# Patient Record
Sex: Female | Born: 1995 | Race: White | Hispanic: No | Marital: Single | Smoking: Never smoker
Health system: Southern US, Community
[De-identification: ages and names within clinical notes are randomized; demographics above are authoritative.]

## PROBLEM LIST (undated history)

## (undated) DIAGNOSIS — J45909 Unspecified asthma, uncomplicated: Secondary | ICD-10-CM

## (undated) DIAGNOSIS — J302 Other seasonal allergic rhinitis: Secondary | ICD-10-CM

## (undated) DIAGNOSIS — F319 Bipolar disorder, unspecified: Secondary | ICD-10-CM

## (undated) DIAGNOSIS — F329 Major depressive disorder, single episode, unspecified: Secondary | ICD-10-CM

## (undated) DIAGNOSIS — F429 Obsessive-compulsive disorder, unspecified: Secondary | ICD-10-CM

## (undated) DIAGNOSIS — F419 Anxiety disorder, unspecified: Secondary | ICD-10-CM

## (undated) DIAGNOSIS — F4310 Post-traumatic stress disorder, unspecified: Secondary | ICD-10-CM

---

## 2018-03-10 ENCOUNTER — Other Ambulatory Visit: Payer: Self-pay

## 2018-03-10 ENCOUNTER — Encounter (HOSPITAL_COMMUNITY): Payer: Self-pay | Admitting: Emergency Medicine

## 2018-03-10 ENCOUNTER — Emergency Department (HOSPITAL_COMMUNITY): Payer: Commercial Managed Care - PPO

## 2018-03-10 ENCOUNTER — Emergency Department (HOSPITAL_COMMUNITY)
Admission: EM | Admit: 2018-03-10 | Discharge: 2018-03-10 | Disposition: A | Discharge status: Home / Self Care | Payer: Commercial Managed Care - PPO | Attending: Emergency Medicine | Admitting: Emergency Medicine

## 2018-03-10 DIAGNOSIS — R110 Nausea: Secondary | ICD-10-CM | POA: Diagnosis not present

## 2018-03-10 DIAGNOSIS — R197 Diarrhea, unspecified: Secondary | ICD-10-CM | POA: Insufficient documentation

## 2018-03-10 DIAGNOSIS — R1031 Right lower quadrant pain: Secondary | ICD-10-CM | POA: Diagnosis not present

## 2018-03-10 DIAGNOSIS — J45909 Unspecified asthma, uncomplicated: Secondary | ICD-10-CM | POA: Diagnosis not present

## 2018-03-10 HISTORY — DX: Unspecified asthma, uncomplicated: J45.909

## 2018-03-10 HISTORY — DX: Other seasonal allergic rhinitis: J30.2

## 2018-03-10 LAB — URINALYSIS, ROUTINE W REFLEX MICROSCOPIC
Bilirubin Urine: NEGATIVE
Glucose, UA: NEGATIVE mg/dL
Hgb urine dipstick: NEGATIVE
Ketones, ur: NEGATIVE mg/dL
Nitrite: NEGATIVE
PH: 7 (ref 5.0–8.0)
Protein, ur: NEGATIVE mg/dL
Specific Gravity, Urine: 1.016 (ref 1.005–1.030)

## 2018-03-10 LAB — CBC WITH DIFFERENTIAL/PLATELET
Abs Immature Granulocytes: 0.03 10*3/uL (ref 0.00–0.07)
BASOS PCT: 0 %
Basophils Absolute: 0 10*3/uL (ref 0.0–0.1)
Eosinophils Absolute: 0.1 10*3/uL (ref 0.0–0.5)
Eosinophils Relative: 1 %
HCT: 39.9 % (ref 36.0–46.0)
Hemoglobin: 13.6 g/dL (ref 12.0–15.0)
Immature Granulocytes: 0 %
Lymphocytes Relative: 28 %
Lymphs Abs: 2.7 10*3/uL (ref 0.7–4.0)
MCH: 29.9 pg (ref 26.0–34.0)
MCHC: 34.1 g/dL (ref 30.0–36.0)
MCV: 87.7 fL (ref 80.0–100.0)
Monocytes Absolute: 0.9 10*3/uL (ref 0.1–1.0)
Monocytes Relative: 10 %
NEUTROS ABS: 5.7 10*3/uL (ref 1.7–7.7)
Neutrophils Relative %: 61 %
PLATELETS: 261 10*3/uL (ref 150–400)
RBC: 4.55 MIL/uL (ref 3.87–5.11)
RDW: 12.1 % (ref 11.5–15.5)
WBC: 9.5 10*3/uL (ref 4.0–10.5)
nRBC: 0 % (ref 0.0–0.2)

## 2018-03-10 LAB — COMPREHENSIVE METABOLIC PANEL
ALT: 33 U/L (ref 0–44)
AST: 32 U/L (ref 15–41)
Albumin: 3.8 g/dL (ref 3.5–5.0)
Alkaline Phosphatase: 51 U/L (ref 38–126)
Anion gap: 8 (ref 5–15)
BUN: 9 mg/dL (ref 6–20)
CO2: 20 mmol/L — ABNORMAL LOW (ref 22–32)
Calcium: 9.3 mg/dL (ref 8.9–10.3)
Chloride: 107 mmol/L (ref 98–111)
Creatinine, Ser: 0.79 mg/dL (ref 0.44–1.00)
GFR calc Af Amer: >60 mL/min (ref >60)
GFR calc non Af Amer: >60 mL/min (ref >60)
Glucose, Bld: 84 mg/dL (ref 70–99)
POTASSIUM: 4 mmol/L (ref 3.5–5.1)
Sodium: 135 mmol/L (ref 135–145)
TOTAL PROTEIN: 6.9 g/dL (ref 6.5–8.1)
Total Bilirubin: 0.8 mg/dL (ref 0.3–1.2)

## 2018-03-10 LAB — I-STAT BETA HCG BLOOD, ED (MC, WL, AP ONLY): I-stat hCG, quantitative: <5 m[IU]/mL (ref <5)

## 2018-03-10 MED ORDER — SODIUM CHLORIDE 0.9 % IV BOLUS
1000.0000 mL | Freq: Once | INTRAVENOUS | Status: AC
  Administered 2018-03-10: 1000 mL via INTRAVENOUS

## 2018-03-10 MED ORDER — ONDANSETRON HCL 4 MG/2ML IJ SOLN
4.0000 mg | Freq: Once | INTRAMUSCULAR | Status: AC
  Administered 2018-03-10: 4 mg via INTRAVENOUS
  Filled 2018-03-10: qty 2

## 2018-03-10 MED ORDER — IOHEXOL 300 MG/ML  SOLN
100.0000 mL | Freq: Once | INTRAMUSCULAR | Status: AC | PRN
  Administered 2018-03-10: 100 mL via INTRAVENOUS

## 2018-03-10 NOTE — ED Notes (Signed)
Pt transported to CT ?

## 2018-03-10 NOTE — ED Provider Notes (Signed)
[facility] EMERGENCY DEPARTMENT Provider Note   CSN: [id number] Arrival date & time: 03/10/18  1311     History   Chief Complaint Chief Complaint  Patient presents with  . Abdominal Pain    HPI Marvelle Cervantes is a 23 y.o. female with a past medical history of IBS, who presents to ED for evaluation of right lower quadrant abdominal pain that began 3 hours prior to arrival.  She reports sharp pain, worse with palpation.  She had several episodes of diarrhea since waking up this morning.  She reports nausea but no emesis.  She is concerned that she may have appendicitis.  Denies any urinary symptoms, vaginal complaints, possibility of pregnancy, concern for STDs, prior abdominal surgeries.  She has not tried taking anything at home to help with her symptoms.  She is currently sexually active with one female partner. No recent abx use.  HPI  Past Medical History:  Diagnosis Date  . Asthma   . Seasonal allergies     There are no active problems to display for this patient.   History reviewed. No pertinent surgical history.   OB History   No obstetric history on file.      Home Medications    Prior to Admission medications   Not on File    Family History No family history on file.  Social History Social History   Tobacco Use  . Smoking status: Never Smoker  . Smokeless tobacco: Never Used  Substance Use Topics  . Alcohol use: Yes  . Drug use: Never     Allergies   Augmentin [amoxicillin-pot clavulanate]   Review of Systems Review of Systems  Constitutional: Negative for appetite change, chills and fever.  HENT: Negative for ear pain, rhinorrhea, sneezing and sore throat.   Eyes: Negative for photophobia and visual disturbance.  Respiratory: Negative for cough, chest tightness, shortness of breath and wheezing.   Cardiovascular: Negative for chest pain and palpitations.  Gastrointestinal: Positive for abdominal pain, diarrhea and nausea.  Negative for blood in stool, constipation and vomiting.  Genitourinary: Negative for dysuria, hematuria and urgency.  Musculoskeletal: Negative for myalgias.  Skin: Negative for rash.  Neurological: Negative for dizziness, weakness and light-headedness.     Physical Exam Updated Vital Signs BP 136/90   Pulse 80   Temp 98.8 F (37.1 C)   Resp 18   Ht 5\' 11"  (1.803 m)   Wt 81.6 kg   LMP 02/27/2018   SpO2 100%   BMI 25.10 kg/m   Physical Exam Vitals signs and nursing note reviewed.  Constitutional:      General: She is not in acute distress.    Appearance: She is well-developed.  HENT:     Head: Normocephalic and atraumatic.     Nose: Nose normal.  Eyes:     General: No scleral icterus.       Left eye: No discharge.     Conjunctiva/sclera: Conjunctivae normal.  Neck:     Musculoskeletal: Normal range of motion and neck supple.  Cardiovascular:     Rate and Rhythm: Normal rate and regular rhythm.     Heart sounds: Normal heart sounds. No murmur. No friction rub. No gallop.   Pulmonary:     Effort: Pulmonary effort is normal. No respiratory distress.     Breath sounds: Normal breath sounds.  Abdominal:     General: Bowel sounds are normal. There is no distension.     Palpations: Abdomen is soft.  Tenderness: There is abdominal tenderness. There is no guarding or rebound.    Musculoskeletal: Normal range of motion.  Skin:    General: Skin is warm and dry.     Findings: No rash.  Neurological:     Mental Status: She is alert.     Motor: No abnormal muscle tone.     Coordination: Coordination normal.      ED Treatments / Results  Labs (all labs ordered are listed, but only abnormal results are displayed) Labs Reviewed  COMPREHENSIVE METABOLIC PANEL - Abnormal; Notable for the following components:      Result Value   CO2 20 (*)    All other components within normal limits  URINALYSIS, ROUTINE W REFLEX MICROSCOPIC - Abnormal; Notable for the following  components:   APPearance HAZY (*)    Leukocytes, UA TRACE (*)    Bacteria, UA RARE (*)    All other components within normal limits  CBC WITH DIFFERENTIAL/PLATELET  I-STAT BETA HCG BLOOD, ED ([facility], [facility], [facility] ONLY)    EKG None  Radiology No results found.  Procedures Procedures (including critical care time)  Medications Ordered in ED Medications  sodium chloride 0.9 % bolus 1,000 mL (has no administration in time range)  ondansetron (ZOFRAN) injection 4 mg (has no administration in time range)  iohexol (OMNIPAQUE) 300 MG/ML solution 100 mL (100 mLs Intravenous Contrast Given 03/10/18 1448)     Initial Impression / Assessment and Plan / ED Course  I have reviewed the triage vital signs and the nursing notes.  Pertinent labs & imaging results that were available during my care of the patient were reviewed by me and considered in my medical decision making (see chart for details).     23yo F with a past medical history of IBS presents to ED for evaluation of RLQ abdominal pain, nausea and diarrhea since this morning.  States that she has had several episodes of diarrhea and then began developing the right lower quadrant abdominal pain for the past 3 hours.  Denies any urinary symptoms, vaginal complaints, possibility of pregnancy.  States she is sexually active with one female partner, is not concerned for STDs and denies vaginal discharge.  On my exam there is tenderness palpation of the right middle/right lower quadrant without rebound or guarding.  Vital signs are within normal limits.  Plan is to obtain lab work, CT of the abdomen pelvis to evaluate for appendicitis or other acute pathology.  Patient will be given fluids and Zofran. Care handed off to oncoming provider.  Final Clinical Impressions(s) / ED Diagnoses   Final diagnoses:  None    ED Discharge Orders    None       Dietrich PatesKhatri, Shaquile Lutze, PA-C 03/10/18 1459    Sabas SousBero, Michael M, MD 03/11/18 1044

## 2018-03-10 NOTE — Discharge Instructions (Signed)
Please return if worsening (vomiting, severe diarrhea, severe, lasting abdominal pain)

## 2018-03-10 NOTE — ED Provider Notes (Signed)
Pt signed out to me by [facility] PA-C. She had severe R sided abdominal pain this morning which has since resolved. She has had nausea and a couple episodes of diarrhea. CT abdomen/pelvis is pending at shift change to r/o appendicitis. Labs and UA are normal.  CT is negative. Rechecked pt. Abdomen is soft and non-tender. Could be transient GI illness with nausea and diarrhea. She was offered pelvic and declined. She was given reassurance and return precautions.   Bethel Born, PA-C 03/10/18 1637    Sabas Sous, MD 03/11/18 [phone]

## 2018-03-10 NOTE — ED Triage Notes (Signed)
Pt report r lower abd pain with diarrhea x1 day

## 2019-11-01 IMAGING — CT CT ABD-PELV W/ CM
2 of 4 series · 16 of 46 positions shown, 18 images · IV contrast (APPLIED)
Comparison: None.

CLINICAL DATA: Right lower quadrant abdominal pain and diarrhea for
the past day.

EXAM:
CT ABDOMEN AND PELVIS WITH CONTRAST
TECHNIQUE: Multidetector CT imaging of the abdomen and pelvis was performed
using the standard protocol following bolus administration of
intravenous contrast.
CONTRAST:  100mL OMNIPAQUE IOHEXOL 300 MG/ML  SOLN

[Series 3: abdomen 5.0 · axial · 0.82mm/px · z∈[+745,+1195]mm · 13 of 101 slices shown, 15 images]
[im 6/101  soft-tissue]
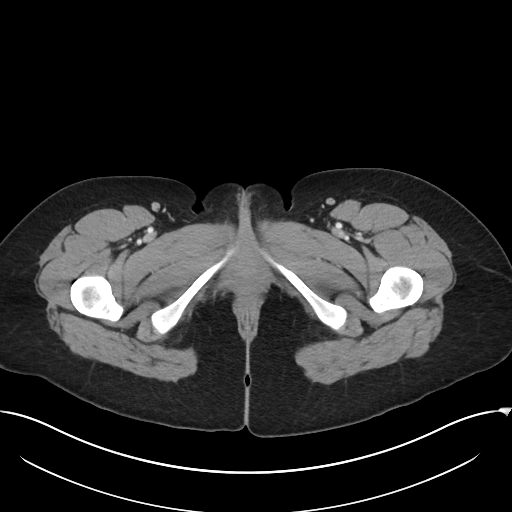
[im 6/101  bone]
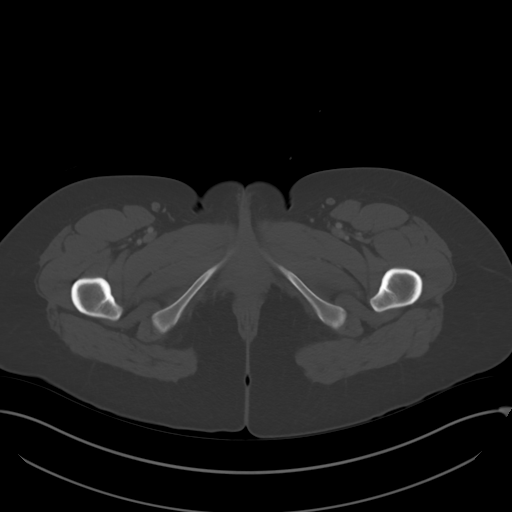
[im 16/101  soft-tissue]
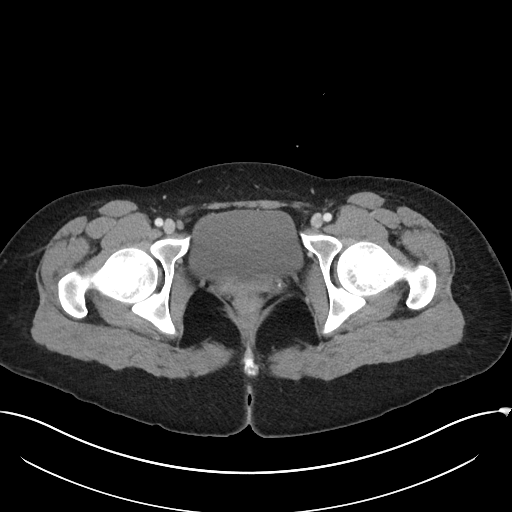
[im 21/101  soft-tissue]
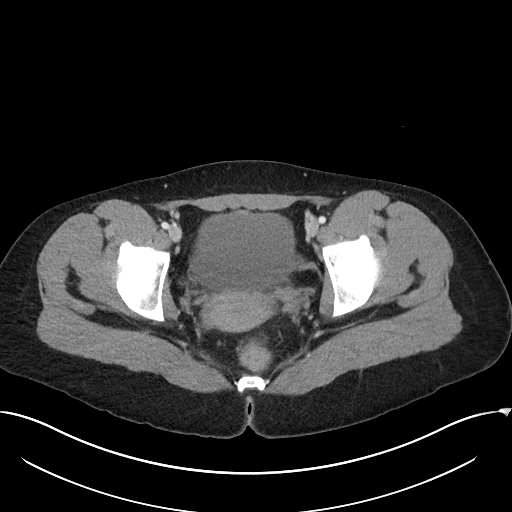
[im 31/101  soft-tissue]
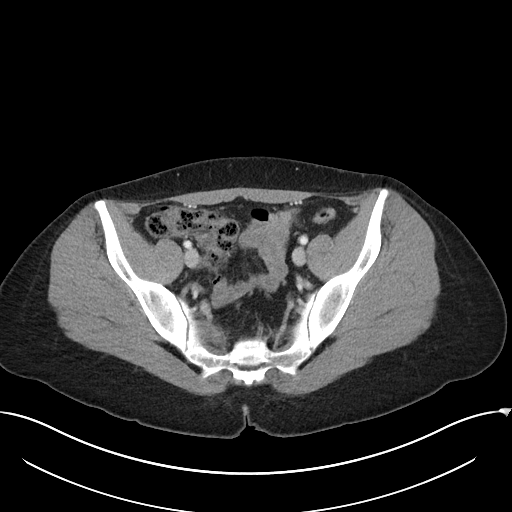
[im 36/101  soft-tissue]
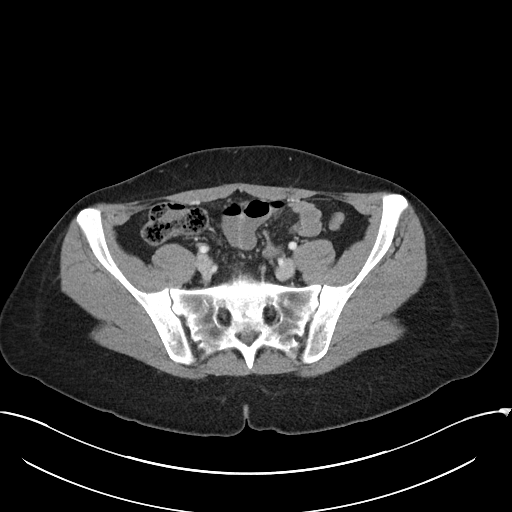
[im 46/101  soft-tissue]
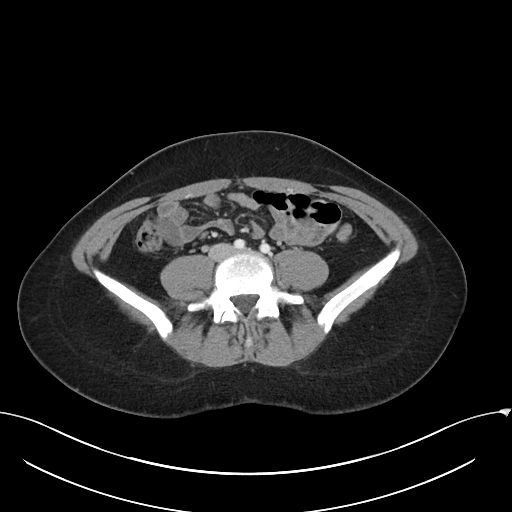
[im 51/101  soft-tissue]
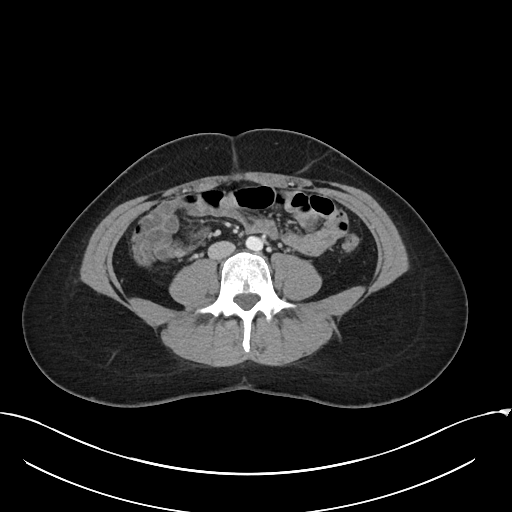
[im 56/101  soft-tissue]
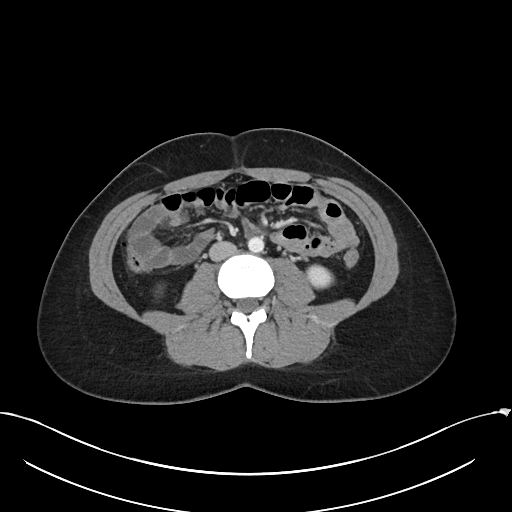
[im 66/101  soft-tissue]
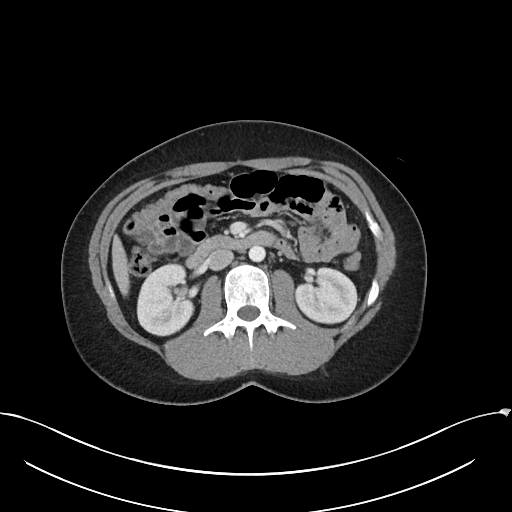
[im 66/101  bone]
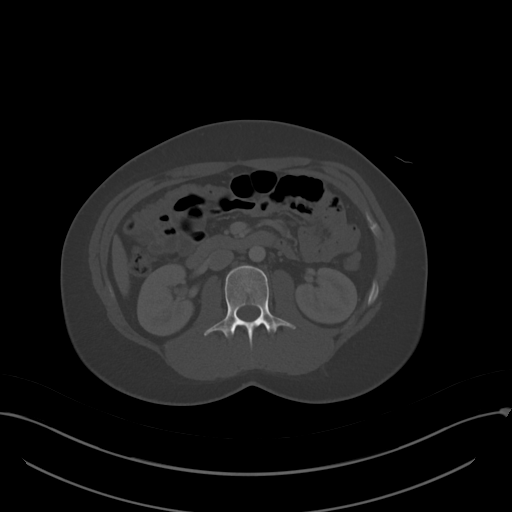
[im 71/101  soft-tissue]
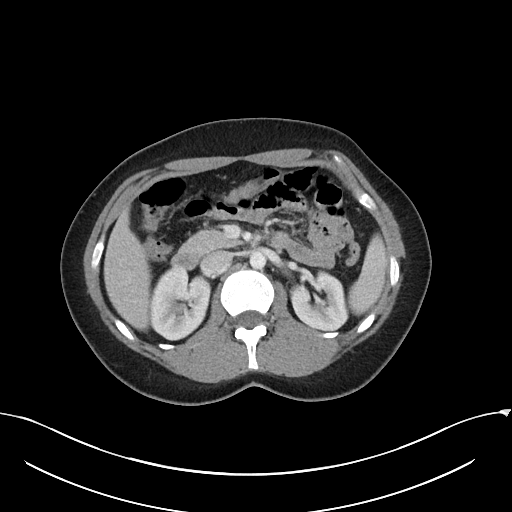
[im 81/101  soft-tissue]
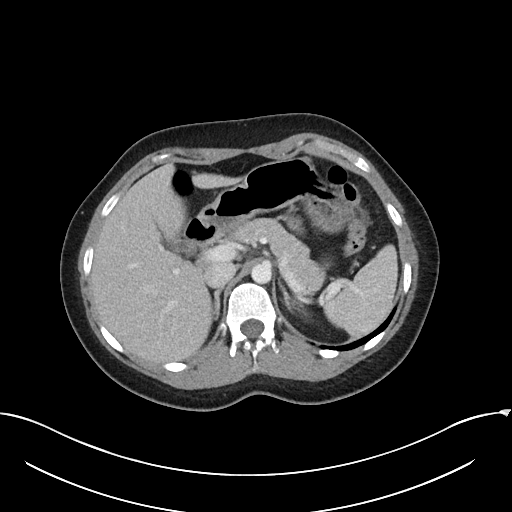
[im 86/101  soft-tissue]
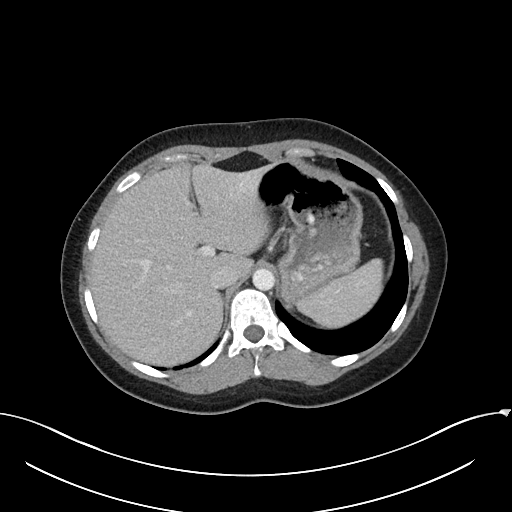
[im 96/101  soft-tissue]
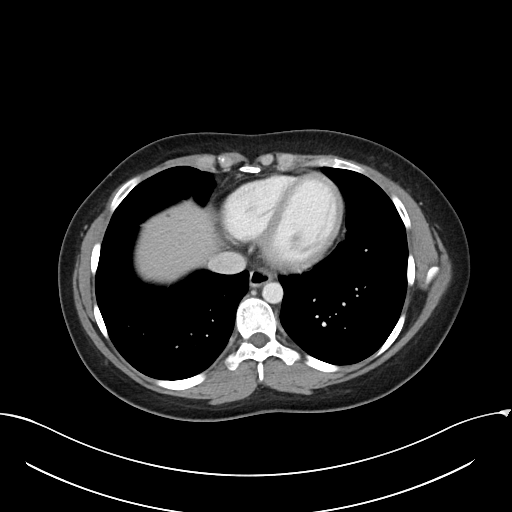

[Series 6: abdomen 3.0 mpr cor · coronal · 0.85mm/px · 3 of 101 slices shown]
[im 34/101  soft-tissue]
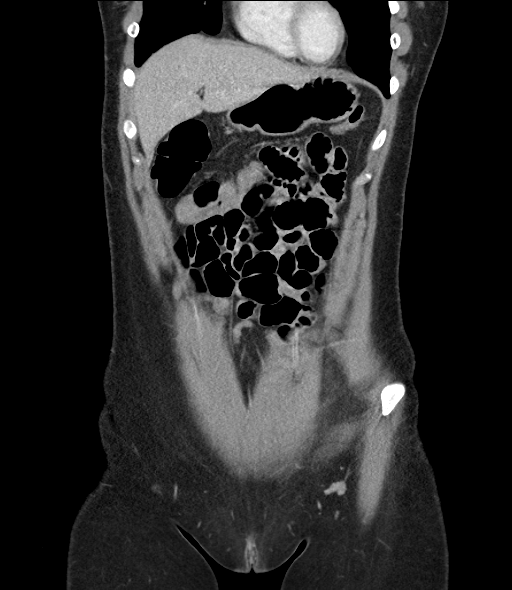
[im 45/101  soft-tissue]
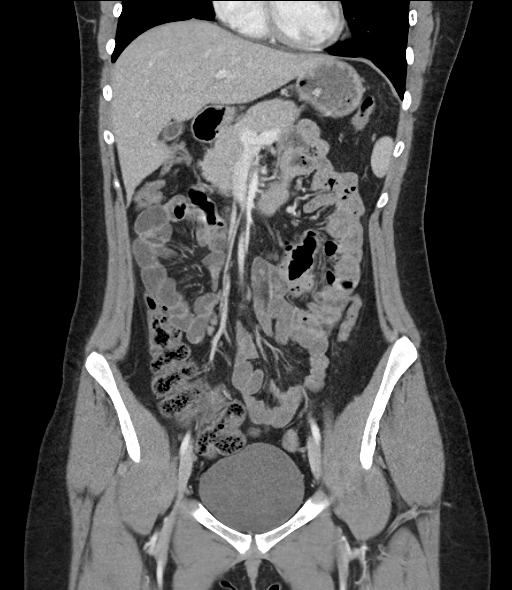
[im 56/101  soft-tissue]
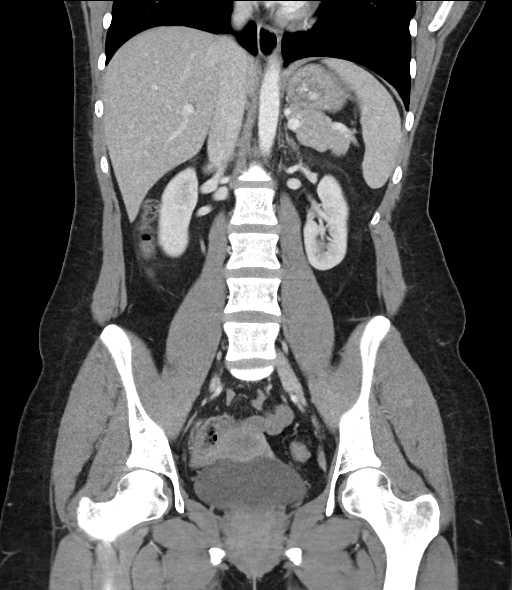

[16 of 46 positions shown; findings below may reference images not displayed]

FINDINGS: Lower chest: No acute abnormality.

Hepatobiliary: No focal liver abnormality is seen. No gallstones,
gallbladder wall thickening, or biliary dilatation.

Pancreas: Unremarkable. No pancreatic ductal dilatation or
surrounding inflammatory changes.

Spleen: Normal in size without focal abnormality.

Adrenals/Urinary Tract: Adrenal glands are unremarkable. Kidneys are
normal, without renal calculi, focal lesion, or hydronephrosis.
Bladder is unremarkable.

Stomach/Bowel: Stomach is within normal limits. Appendix appears
normal. No evidence of bowel wall thickening, distention, or
inflammatory changes.

Vascular/Lymphatic: No significant vascular findings are present. No
enlarged abdominal or pelvic lymph nodes.

Reproductive: Uterus and bilateral adnexa are unremarkable.

Other: Tiny fat containing umbilical hernia. No free fluid or
pneumoperitoneum.

Musculoskeletal: No acute or significant osseous findings.
IMPRESSION: 1.  No acute intra-abdominal process.  Normal appendix.

## 2020-11-05 ENCOUNTER — Encounter: Payer: Self-pay | Admitting: Emergency Medicine

## 2020-11-05 ENCOUNTER — Ambulatory Visit
Admission: EM | Admit: 2020-11-05 | Discharge: 2020-11-05 | Disposition: A | Discharge status: Home / Self Care | Payer: No Typology Code available for payment source

## 2020-11-05 DIAGNOSIS — R21 Rash and other nonspecific skin eruption: Secondary | ICD-10-CM | POA: Diagnosis not present

## 2020-11-05 DIAGNOSIS — L509 Urticaria, unspecified: Secondary | ICD-10-CM

## 2020-11-05 DIAGNOSIS — U071 COVID-19: Secondary | ICD-10-CM

## 2020-11-05 DIAGNOSIS — R0602 Shortness of breath: Secondary | ICD-10-CM | POA: Diagnosis not present

## 2020-11-05 MED ORDER — CETIRIZINE HCL 10 MG PO TABS: 10.0000 mg | ORAL_TABLET | Freq: Every day | ORAL | 0 refills | Status: DC

## 2020-11-05 MED ORDER — ALBUTEROL SULFATE HFA 108 (90 BASE) MCG/ACT IN AERS
2.0000 | INHALATION_SPRAY | Freq: Once | RESPIRATORY_TRACT | Status: AC
  Administered 2020-11-05: 2 via RESPIRATORY_TRACT

## 2020-11-05 MED ORDER — PREDNISONE 10 MG (21) PO TBPK: ORAL_TABLET | ORAL | 0 refills | Status: DC

## 2020-11-05 MED ORDER — FAMOTIDINE 40 MG PO TABS: 40.0000 mg | ORAL_TABLET | Freq: Every day | ORAL | 0 refills | Status: AC

## 2020-11-05 NOTE — ED Provider Notes (Signed)
[facility]    CSN: [id number] Arrival date & time: 11/05/20  1447      History   Chief Complaint Chief Complaint  Patient presents with   Rash    HPI Dana Hawkins is a 25 y.o. female.   Patient presents today with a several day history of recurrent pruritic rash on her legs and upper extremities.  She reports an episode of similar symptoms previously that correspond with the beginning of gabapentin but she has since stopped this medication and has had recurrent symptoms despite not being on this medication.  She did start feeling poorly and took a COVID test yesterday that was positive.  She is wondering if this could be a reaction to COVID as symptoms have worsened and spread on her legs since that time.  She has been taking ibuprofen, DayQuil, NyQuil without improvement of symptoms.  She has had COVID in the past several years ago and is vaccinated for COVID.  She reports mild congestion, fever, headache, nasal congestion. She denies any changes to personal hygiene products including soaps or detergents.  She denies any history of urticaria.  Denies any shortness of breath, throat swelling, difficulty swallowing.  She does have a history of asthma and has felt short of breath since having COVID; she does not have albuterol inhaler that she was without adequate entrance.   Past Medical History:  Diagnosis Date   Asthma    Seasonal allergies     There are no problems to display for this patient.   History reviewed. No pertinent surgical history.  OB History   No obstetric history on file.      Home Medications    Prior to Admission medications   Medication Sig Start Date End Date Taking? Authorizing Provider  busPIRone (BUSPAR) 7.5 MG tablet Take 7.5 mg by mouth 3 (three) times daily. 07/13/20  Yes [provider]  cetirizine (ZYRTEC ALLERGY) 10 MG tablet Take 1 tablet (10 mg total) by mouth daily. 11/05/20  Yes Sky Borboa K, PA-C  ergocalciferol  (VITAMIN D2) 1.25 MG (50000 UT) capsule TAKE 1 CAPSULE BY MOUTH ONCE A WEEK FOR 12 WEEKS 11/04/20  Yes [provider]  famotidine (PEPCID) 40 MG tablet Take 1 tablet (40 mg total) by mouth at bedtime. 11/05/20  Yes Shady Bradish, Noberto Retort, PA-C  lamoTRIgine (LAMICTAL) 25 MG tablet Take 1 tablet by mouth daily. 09/06/20  Yes [provider]  NIKKI 3-0.02 MG tablet Take 1 tablet by mouth daily. 09/01/20  Yes [provider]  predniSONE (STERAPRED UNI-PAK 21 TAB) 10 MG (21) TBPK tablet As directed 11/05/20  Yes Taline Nass, Noberto Retort, PA-C    Family History History reviewed. No pertinent family history.  Social History Social History   Tobacco Use   Smoking status: Never   Smokeless tobacco: Never  Substance Use Topics   Alcohol use: Yes   Drug use: Never     Allergies   Augmentin [amoxicillin-pot clavulanate]   Review of Systems Review of Systems  Constitutional:  Positive for activity change, chills, fatigue and fever. Negative for appetite change.  HENT:  Positive for congestion. Negative for sinus pressure, sneezing and sore throat.   Respiratory:  Positive for shortness of breath. Negative for cough.   Cardiovascular:  Negative for chest pain.  Gastrointestinal:  Negative for abdominal pain, diarrhea, nausea and vomiting.  Musculoskeletal:  Negative for arthralgias and myalgias.  Skin:  Positive for rash.  Neurological:  Negative for dizziness, light-headedness and headaches.  Physical Exam Triage Vital Signs ED Triage Vitals  Enc Vitals Group     BP 11/05/20 1636 126/83     Pulse Rate 11/05/20 1636 82     Resp --      Temp 11/05/20 1636 98.1 F (36.7 C)     Temp Source 11/05/20 1636 Oral     SpO2 11/05/20 1636 96 %     Weight 11/05/20 1637 165 lb (74.8 kg)     Height 11/05/20 1637 5\' 10"  (1.778 m)     Head Circumference --      Peak Flow --      Pain Score 11/05/20 1637 0     Pain Loc --      Pain Edu? --      Excl. in GC? --    No data  found.  Updated Vital Signs BP 126/83 (BP Location: Left Arm)   Pulse 82   Temp 98.1 F (36.7 C) (Oral)   Ht 5\' 10"  (1.778 m)   Wt 165 lb (74.8 kg)   LMP 11/05/2020   SpO2 96%   BMI 23.68 kg/m   Visual Acuity Right Eye Distance:   Left Eye Distance:   Bilateral Distance:    Right Eye Near:   Left Eye Near:    Bilateral Near:     Physical Exam Vitals reviewed.  Constitutional:      General: She is awake. She is not in acute distress.    Appearance: Normal appearance. She is normal weight. She is not ill-appearing.     Comments: Very pleasant female appears stated age in no acute distress sitting comfortably in exam room  HENT:     Head: Normocephalic and atraumatic.     Right Ear: External ear normal.     Left Ear: External ear normal.     Nose:     Right Sinus: No maxillary sinus tenderness or frontal sinus tenderness.     Left Sinus: No maxillary sinus tenderness or frontal sinus tenderness.     Mouth/Throat:     Pharynx: Uvula midline. Posterior oropharyngeal erythema present. No oropharyngeal exudate.  Cardiovascular:     Rate and Rhythm: Normal rate and regular rhythm.     Heart sounds: Normal heart sounds, S1 normal and S2 normal. No murmur heard. Pulmonary:     Effort: Pulmonary effort is normal.     Breath sounds: Wheezing present. No rhonchi or rales.     Comments: Wheezing at bilateral bases resolved with in office albuterol Lymphadenopathy:     Head:     Right side of head: No submental, submandibular or tonsillar adenopathy.     Left side of head: No submental, submandibular or tonsillar adenopathy.     Cervical: No cervical adenopathy.  Skin:    Findings: Rash present. Rash is urticarial.     Comments: Urticarial rash noted on extremities.  Psychiatric:        Behavior: Behavior is cooperative.     UC Treatments / Results  Labs (all labs ordered are listed, but only abnormal results are displayed) Labs Reviewed - No data to  display  EKG   Radiology No results found.  Procedures Procedures (including critical care time)  Medications Ordered in UC Medications  albuterol (VENTOLIN HFA) 108 (90 Base) MCG/ACT inhaler 2 puff (2 puffs Inhalation Given 11/05/20 1716)    Initial Impression / Assessment and Plan / UC Course  I have reviewed the triage vital signs and the nursing notes.  Pertinent labs &  imaging results that were available during my care of the patient were reviewed by me and considered in my medical decision making (see chart for details).      Discussed that hives could be related to COVID or potential allergy.  Her vital signs and physical exam are reassuring today and she was given albuterol with improvement of symptoms.  Was started on prednisone taper to help with hives as well as COVID symptoms with instruction not to take NSAIDs with this medication due to risk of GI bleeding.  We will alternate H1 and H2 blockers.  Recommend she use hypoallergenic soaps and detergents.  Discussed that if symptoms recur she would need to see an allergist for further evaluation and management.  Discussed there is the low likelihood that Lamictal could be contributing to her symptoms and if she continues to have rash particularly if it spreads or if she develops any oral lesions she needs to be reevaluated.  Discussed alarm symptoms that warrant emergent evaluation.  Strict return precautions given to which patient expressed understanding.  Final Clinical Impressions(s) / UC Diagnoses   Final diagnoses:  COVID-19  Shortness of breath  Hives  Rash     Discharge Instructions      Begin Zyrtec and Pepcid as prescribed.  Use albuterol as needed.  I have also called in a prednisone taper and you should not take NSAIDs including aspirin, ibuprofen/Advil, naproxen/Aleve with this medication as can cause stomach bleeding.  Make sure you are resting and drinking plenty fluid.  You can use over-the-counter  medications including Flonase, Tylenol, Mucinex for symptom relief.  There is a possibility that your Lamictal could be contributing to symptoms and if you continue to have a rash or if at any point you develop mouth ulcers you need to be seen immediately.  Please stay in quarantine for 5 days from when your COVID symptoms started and wear a mask for additional 5 days when to return to normal activities assuming improvement of symptoms.  If you develop any worsening symptoms you need to be reevaluated.  Follow-up with your primary care provider consider referral to allergist if rash recurs.     ED Prescriptions     Medication Sig Dispense Auth. Provider   cetirizine (ZYRTEC ALLERGY) 10 MG tablet Take 1 tablet (10 mg total) by mouth daily. 30 tablet Nesa Distel K, PA-C   famotidine (PEPCID) 40 MG tablet Take 1 tablet (40 mg total) by mouth at bedtime. 30 tablet Neesha Langton K, PA-C   predniSONE (STERAPRED UNI-PAK 21 TAB) 10 MG (21) TBPK tablet As directed 21 tablet Chyann Ambrocio K, PA-C      PDMP not reviewed this encounter.   Jeani Hawking, PA-C 11/05/20 1719

## 2020-11-05 NOTE — ED Triage Notes (Signed)
Patient has a rash, possible hives down both sides of legs.  Patient does have a COVID and has been running a low grade fever.  Patient has been taken Ibuprofen, Day and Nyquil.  Patient is vaccinated for COVID.

## 2020-11-05 NOTE — Discharge Instructions (Signed)
Begin Zyrtec and Pepcid as prescribed.  Use albuterol as needed.  I have also called in a prednisone taper and you should not take NSAIDs including aspirin, ibuprofen/Advil, naproxen/Aleve with this medication as can cause stomach bleeding.  Make sure you are resting and drinking plenty fluid.  You can use over-the-counter medications including Flonase, Tylenol, Mucinex for symptom relief.  There is a possibility that your Lamictal could be contributing to symptoms and if you continue to have a rash or if at any point you develop mouth ulcers you need to be seen immediately.  Please stay in quarantine for 5 days from when your COVID symptoms started and wear a mask for additional 5 days when to return to normal activities assuming improvement of symptoms.  If you develop any worsening symptoms you need to be reevaluated.  Follow-up with your primary care provider consider referral to allergist if rash recurs.

## 2023-05-12 ENCOUNTER — Ambulatory Visit
Admission: EM | Admit: 2023-05-12 | Discharge: 2023-05-12 | Disposition: A | Discharge status: Home / Self Care | Attending: Emergency Medicine | Admitting: Emergency Medicine

## 2023-05-12 DIAGNOSIS — T7840XA Allergy, unspecified, initial encounter: Secondary | ICD-10-CM | POA: Diagnosis not present

## 2023-05-12 HISTORY — DX: Bipolar disorder, unspecified: F31.9

## 2023-05-12 HISTORY — DX: Major depressive disorder, single episode, unspecified: F32.9

## 2023-05-12 HISTORY — DX: Post-traumatic stress disorder, unspecified: F43.10

## 2023-05-12 HISTORY — DX: Anxiety disorder, unspecified: F41.9

## 2023-05-12 HISTORY — DX: Obsessive-compulsive disorder, unspecified: F42.9

## 2023-05-12 MED ORDER — CETIRIZINE HCL 10 MG PO TABS: 10.0000 mg | ORAL_TABLET | Freq: Every day | ORAL | 0 refills | Status: AC

## 2023-05-12 MED ORDER — PREDNISONE 10 MG (21) PO TBPK: ORAL_TABLET | Freq: Every day | ORAL | 0 refills | Status: AC

## 2023-05-12 NOTE — ED Triage Notes (Signed)
 Pt states that she has some swelling of her lip x2 weeks Pt states that she is having an allergic reaction. Pt states that she also has hives on her body as well. Pt states that this comes and goes.

## 2023-05-12 NOTE — Discharge Instructions (Addendum)
 It is unclear what is causing your allergic reaction.  You can continue to take 25 mg of Benadryl every 6 hours as needed for any breakthrough hives or lip swelling.  Start the daily antihistamine, Zyrtec.  Start the steroid taper as well, take this daily with breakfast until finished.  I suggest following up with allergy and asthma for repeat allergy testing to find what is causing your symptoms.  Hypoallergenic soaps, lotions and detergents are strongly recommended.  Seek immediate care for any trouble breathing, trouble swallowing, or worsening oral swelling at the nearest emergency department.

## 2023-05-12 NOTE — ED Provider Notes (Signed)
 [facility]    CSN: [id number] Arrival date & time: 05/12/23  1041      History   Chief Complaint Chief Complaint  Patient presents with   Allergic Reaction   Oral Swelling    HPI Dana Hawkins is a 28 y.o. female.   Patient presents to clinic over concerns of ongoing intermittent hives and lip swelling for the past 2 weeks.  Reports this has been ongoing since a COVID-19 infection a few years ago.  When the weather changes she gets hot and will break out in hives.  This is the first time that she has had lip swelling and left under eye swelling it was intermittent.  She woke up today around 7 and was fine, went back to bed and at 830 she woke up with her lip swelling.  She is not having any trouble breathing, trouble swallowing, controlling secretions or shortness of breath.  With waking up with lip swelling she did take 50 mg of Benadryl.  She had hives across her body and these have since improved after the Benadryl but the lip swelling and left under eye swelling remain.  Has not changed any soaps, lotions or detergents recently.  Did have allergy testing years ago where she was allergic to dust.  Denies changes to diet or environment.  The history is provided by the patient and medical records.  Allergic Reaction   Past Medical History:  Diagnosis Date   Anxiety    Asthma    Bipolar 1 disorder (HCC)    Major depressive disorder    OCD (obsessive compulsive disorder)    PTSD (post-traumatic stress disorder)    Seasonal allergies     There are no active problems to display for this patient.   History reviewed. No pertinent surgical history.  OB History   No obstetric history on file.      Home Medications    Prior to Admission medications   Medication Sig Start Date End Date Taking? Authorizing Provider  busPIRone (BUSPAR) 7.5 MG tablet Take 5 mg by mouth 3 (three) times daily. 07/13/20  Yes [provider]  cetirizine (ZYRTEC ALLERGY)  10 MG tablet Take 1 tablet (10 mg total) by mouth daily. 05/12/23 06/11/23 Yes Rinaldo Ratel, [place] N, FNP  lamoTRIgine (LAMICTAL) 25 MG tablet Take 1 tablet by mouth daily. 09/06/20  Yes [provider]  NIKKI 3-0.02 MG tablet Take 1 tablet by mouth daily. 09/01/20  Yes [provider]  predniSONE (STERAPRED UNI-PAK 21 TAB) 10 MG (21) TBPK tablet Take by mouth daily. Take 6 tabs by mouth daily  for 2 days, then 5 tabs for 2 days, then 4 tabs for 2 days, then 3 tabs for 2 days, 2 tabs for 2 days, then 1 tab by mouth daily for 2 days 05/12/23  Yes Rinaldo Ratel, [place] N, FNP  ergocalciferol (VITAMIN D2) 1.25 MG (50000 UT) capsule TAKE 1 CAPSULE BY MOUTH ONCE A WEEK FOR 12 WEEKS 11/04/20   [provider]  famotidine (PEPCID) 40 MG tablet Take 1 tablet (40 mg total) by mouth at bedtime. 11/05/20   Raspet, Noberto Retort, PA-C    Family History History reviewed. No pertinent family history.  Social History Social History   Tobacco Use   Smoking status: Never   Smokeless tobacco: Never  Vaping Use   Vaping status: Never Used  Substance Use Topics   Alcohol use: Yes    Comment: occ   Drug use: Never  Allergies   Doxepin, Gabapentin, and Augmentin [amoxicillin-pot clavulanate]   Review of Systems Review of Systems  Per HPI  Physical Exam Triage Vital Signs ED Triage Vitals  Encounter Vitals Group     BP 05/12/23 1055 125/88     Systolic BP Percentile --      Diastolic BP Percentile --      Pulse Rate 05/12/23 1055 89     Resp 05/12/23 1055 18     Temp 05/12/23 1055 98.1 F (36.7 C)     Temp Source 05/12/23 1055 Oral     SpO2 05/12/23 1055 96 %     Weight 05/12/23 1052 190 lb (86.2 kg)     Height 05/12/23 1052 5\' 10"  (1.778 m)     Head Circumference --      Peak Flow --      Pain Score 05/12/23 1052 0     Pain Loc --      Pain Education --      Exclude from Growth Chart --    No data found.  Updated Vital Signs BP 125/88 (BP Location: Right Arm)   Pulse 89    Temp 98.1 F (36.7 C) (Oral)   Resp 18   Ht 5\' 10"  (1.778 m)   Wt 190 lb (86.2 kg)   LMP 04/27/2023   SpO2 96%   BMI 27.26 kg/m   Visual Acuity Right Eye Distance:   Left Eye Distance:   Bilateral Distance:    Right Eye Near:   Left Eye Near:    Bilateral Near:     Physical Exam Vitals and nursing note reviewed.  Constitutional:      Appearance: Normal appearance.  HENT:     Head: Normocephalic and atraumatic.     Right Ear: External ear normal.     Left Ear: External ear normal.     Nose: Nose normal.     Mouth/Throat:     Mouth: Mucous membranes are moist.     Comments: Upper and lower lip swelling as well as left under eye swelling.  Able to speak in full sentences, no signs of airway compromise. Eyes:     Conjunctiva/sclera: Conjunctivae normal.  Cardiovascular:     Rate and Rhythm: Normal rate.  Pulmonary:     Effort: Pulmonary effort is normal. No respiratory distress.  Skin:    General: Skin is warm and dry.     Findings: Rash present. Rash is urticarial.     Comments: Urticarial hives across thighs that is improved after Benadryl.  Neurological:     General: No focal deficit present.     Mental Status: She is alert.  Psychiatric:        Mood and Affect: Mood normal.        Behavior: Behavior is cooperative.      UC Treatments / Results  Labs (all labs ordered are listed, but only abnormal results are displayed) Labs Reviewed - No data to display  EKG   Radiology No results found.  Procedures Procedures (including critical care time)  Medications Ordered in UC Medications - No data to display  Initial Impression / Assessment and Plan / UC Course  I have reviewed the triage vital signs and the nursing notes.  Pertinent labs & imaging results that were available during my care of the patient were reviewed by me and considered in my medical decision making (see chart for details).  Vitals and triage reviewed, patient is hemodynamically  stable.  Does  have lip swelling and left under eye swelling, no signs of airway compromise, angioedema, trouble swallowing or controlling secretions.  Able to speak in full sentences.  Hives to bilateral thighs, improving with Benadryl.  Unclear etiology, appears allergic in nature.  Will trial steroid taper due to ongoing hives and rash.  Advise daily antihistamine and repeat allergy testing.  Plan of care, follow-up care and strict emergency precautions given if symptoms evolve, patient verbalized understanding, no questions at this time.    Final Clinical Impressions(s) / UC Diagnoses   Final diagnoses:  Allergic reaction, initial encounter     Discharge Instructions      It is unclear what is causing your allergic reaction.  You can continue to take 25 mg of Benadryl every 6 hours as needed for any breakthrough hives or lip swelling.  Start the daily antihistamine, Zyrtec.  Start the steroid taper as well, take this daily with breakfast until finished.  I suggest following up with allergy and asthma for repeat allergy testing to find what is causing your symptoms.  Hypoallergenic soaps, lotions and detergents are strongly recommended.  Seek immediate care for any trouble breathing, trouble swallowing, or worsening oral swelling at the nearest emergency department.     ED Prescriptions     Medication Sig Dispense Auth. Provider   predniSONE (STERAPRED UNI-PAK 21 TAB) 10 MG (21) TBPK tablet Take by mouth daily. Take 6 tabs by mouth daily  for 2 days, then 5 tabs for 2 days, then 4 tabs for 2 days, then 3 tabs for 2 days, 2 tabs for 2 days, then 1 tab by mouth daily for 2 days 42 tablet Rinaldo Ratel, [place] N, FNP   cetirizine (ZYRTEC ALLERGY) 10 MG tablet Take 1 tablet (10 mg total) by mouth daily. 30 tablet Dalaya Suppa, [place] N, [place]      PDMP not reviewed this encounter.   Denessa Cavan, [place] N, [place] 05/12/23 1118
